# Patient Record
Sex: Male | Born: 1985 | Race: Black or African American | Hispanic: No | Marital: Single
Health system: Southern US, Community
[De-identification: ages and names within clinical notes are randomized; demographics above are authoritative.]

---

## 2013-09-17 ENCOUNTER — Emergency Department (HOSPITAL_COMMUNITY)
Admission: EM | Admit: 2013-09-17 | Discharge: 2013-09-17 | Disposition: A | Discharge status: Home / Self Care | Payer: Self-pay | Attending: Emergency Medicine | Admitting: Emergency Medicine

## 2013-09-17 ENCOUNTER — Emergency Department (HOSPITAL_COMMUNITY): Payer: Self-pay

## 2013-09-17 DIAGNOSIS — R42 Dizziness and giddiness: Secondary | ICD-10-CM | POA: Insufficient documentation

## 2013-09-17 DIAGNOSIS — K529 Noninfective gastroenteritis and colitis, unspecified: Secondary | ICD-10-CM

## 2013-09-17 DIAGNOSIS — K5289 Other specified noninfective gastroenteritis and colitis: Secondary | ICD-10-CM | POA: Insufficient documentation

## 2013-09-17 LAB — COMPREHENSIVE METABOLIC PANEL
Alkaline Phosphatase: 91 U/L (ref 39–117)
BUN: 12 mg/dL (ref 6–23)
CO2: 24 mEq/L (ref 19–32)
Chloride: 92 mEq/L — ABNORMAL LOW (ref 96–112)
GFR calc Af Amer: >90 mL/min (ref >90)
GFR calc non Af Amer: 81 mL/min — ABNORMAL LOW (ref >90)
Glucose, Bld: 99 mg/dL (ref 70–99)
Potassium: 3.3 mEq/L — ABNORMAL LOW (ref 3.5–5.1)
Total Bilirubin: 1.5 mg/dL — ABNORMAL HIGH (ref 0.3–1.2)

## 2013-09-17 LAB — URINALYSIS, ROUTINE W REFLEX MICROSCOPIC
Bilirubin Urine: NEGATIVE
Ketones, ur: 40 mg/dL — AB
Leukocytes, UA: NEGATIVE
Nitrite: NEGATIVE
Specific Gravity, Urine: 1.016 (ref 1.005–1.030)
Urobilinogen, UA: 0.2 mg/dL (ref 0.0–1.0)
pH: 6 (ref 5.0–8.0)

## 2013-09-17 LAB — CBC WITH DIFFERENTIAL/PLATELET
Hemoglobin: 15.2 g/dL (ref 13.0–17.0)
Lymphocytes Relative: 6 % — ABNORMAL LOW (ref 12–46)
Lymphs Abs: 0.8 10*3/uL (ref 0.7–4.0)
MCH: 31.3 pg (ref 26.0–34.0)
Monocytes Relative: 6 % (ref 3–12)
Neutro Abs: 11 10*3/uL — ABNORMAL HIGH (ref 1.7–7.7)
Neutrophils Relative %: 87 % — ABNORMAL HIGH (ref 43–77)
Platelets: 204 10*3/uL (ref 150–400)
RBC: 4.85 MIL/uL (ref 4.22–5.81)

## 2013-09-17 LAB — LIPASE, BLOOD: Lipase: 19 U/L (ref 11–59)

## 2013-09-17 MED ORDER — ONDANSETRON HCL 4 MG/2ML IJ SOLN
4.0000 mg | Freq: Once | INTRAMUSCULAR | Status: AC
  Administered 2013-09-17: 4 mg via INTRAVENOUS
  Filled 2013-09-17: qty 2

## 2013-09-17 MED ORDER — ONDANSETRON HCL 4 MG PO TABS: 4.0000 mg | ORAL_TABLET | Freq: Four times a day (QID) | ORAL | Status: AC

## 2013-09-17 MED ORDER — SODIUM CHLORIDE 0.9 % IV BOLUS (SEPSIS)
1000.0000 mL | Freq: Once | INTRAVENOUS | Status: AC
  Administered 2013-09-17: 1000 mL via INTRAVENOUS

## 2013-09-17 MED ORDER — ACETAMINOPHEN 325 MG PO TABS
650.0000 mg | ORAL_TABLET | Freq: Once | ORAL | Status: AC
  Administered 2013-09-17: 650 mg via ORAL
  Filled 2013-09-17: qty 2

## 2013-09-17 NOTE — ED Notes (Signed)
Pt has been sick with n/v for the past few days now and this am friends state pt "passed out" while in the bathroom. Pt does remember things denies hitting head. EMS was called and pt decided to come to the ER instead of EMS. Alert x4 at this time.

## 2013-09-17 NOTE — ED Notes (Signed)
Patient resting with eyes closed. Accompanied by roommates. Patient denies pain at this time. Will continue to monitor

## 2013-09-17 NOTE — ED Provider Notes (Signed)
CSN: [id number]     Arrival date & time 09/17/13  0940 History   First MD Initiated Contact with Patient 09/17/13 [phone]     Chief Complaint  Patient presents with  . Near Syncope    HPI Pt has been sick with n/v for the past few days now and this am friends state pt "passed out" while in the bathroom. Pt does remember things denies hitting head. EMS was called and pt decided to come to the ER instead of EMS. Alert x4 at this time.  Patient has taken ibuprofen for headache over the last week or so.  Headache has been typical he takes Profen usually goes away.  Patient denies any diarrhea.  No other roommates are ill with nausea vomiting and/or diarrhea.  Patient did have 3 episodes of forced vomiting on Friday night.  Felt somewhat better but has had decreased appetite and energy over the last 24-48 hours.  No past medical history on file. No past surgical history on file. No family history on file. History  Substance Use Topics  . Smoking status: Not on file  . Smokeless tobacco: Not on file  . Alcohol Use: Not on file    Review of Systems  Constitutional: Positive for chills. Negative for fever.  Gastrointestinal: Negative for abdominal distention.  Neurological: Positive for syncope and light-headedness. Negative for seizures.   All other systems reviewed and are negative Allergies  Review of patient's allergies indicates no known allergies.  Home Medications   Current Outpatient Rx  Name  Route  Sig  Dispense  Refill  . ibuprofen (ADVIL,MOTRIN) 200 MG tablet   Oral   Take 400 mg by mouth every 6 (six) hours as needed.         . Pseudoephedrine-Guaifenesin (MUCINEX D) [phone] MG TB12   Oral   Take 2 tablets by mouth 2 (two) times daily as needed. Congestion         . ondansetron (ZOFRAN) 4 MG tablet   Oral   Take 1 tablet (4 mg total) by mouth every 6 (six) hours.   12 tablet   0    BP 133/73  Pulse 108  Temp(Src) 101.5 F (38.6 C) (Oral)  Resp 16  SpO2  99% Physical Exam  Nursing note and vitals reviewed. Constitutional: He is oriented to person, place, and time. He appears well-developed and well-nourished. No distress (Patient is apparently nauseated).  HENT:  Head: Normocephalic and atraumatic.  Eyes: Pupils are equal, round, and reactive to light.  Neck: Normal range of motion.  Cardiovascular: Normal rate and intact distal pulses.   Pulmonary/Chest: Effort normal. No respiratory distress.  Abdominal: Soft. Normal appearance. He exhibits no distension. There is no tenderness. There is no rebound and no guarding.  Musculoskeletal: Normal range of motion.  Neurological: He is alert and oriented to person, place, and time. No cranial nerve deficit. He exhibits normal muscle tone. Coordination normal.  Skin: Skin is warm and dry. No rash noted.  Psychiatric: He has a normal mood and affect. His behavior is normal.    ED Course  Procedures (including critical care time) Medications  sodium chloride 0.9 % bolus 1,000 mL (1,000 mLs Intravenous New Bag/Given 09/17/13 1303)  sodium chloride 0.9 % bolus 1,000 mL (0 mLs Intravenous Stopped 09/17/13 1303)  ondansetron (ZOFRAN) injection 4 mg (4 mg Intravenous Given 09/17/13 1037)  acetaminophen (TYLENOL) tablet 650 mg (650 mg Oral Given 09/17/13 1037)    Labs Review Labs Reviewed  URINALYSIS, ROUTINE  W REFLEX MICROSCOPIC - Abnormal; Notable for the following:    Ketones, ur 40 (*)    All other components within normal limits  CBC WITH DIFFERENTIAL - Abnormal; Notable for the following:    WBC 12.6 (*)    Neutrophils Relative % 87 (*)    Neutro Abs 11.0 (*)    Lymphocytes Relative 6 (*)    All other components within normal limits  COMPREHENSIVE METABOLIC PANEL - Abnormal; Notable for the following:    Sodium 131 (*)    Potassium 3.3 (*)    Chloride 92 (*)    Total Bilirubin 1.5 (*)    GFR calc non Af Amer 81 (*)    All other components within normal limits  LIPASE, BLOOD    calculated anion gap = 15. Imaging Review Dg Chest 2 View  09/17/2013   CLINICAL DATA:  Fever, syncope  EXAM: CHEST  2 VIEW  COMPARISON:  None.  FINDINGS: Cardiomediastinal silhouette is unremarkable. Mild thoracic dextroscoliosis. No acute infiltrate or pleural effusion. No pulmonary edema.  IMPRESSION: No active cardiopulmonary disease.   Electronically Signed   By: Natasha Mead M.D.   On: 09/17/2013 12:24      MDM   1. Gastroenteritis    Patient feeling much better.  Wants to go home.  Appears stable for discharge.    Nelia Shi, MD 09/17/13 [phone]

## 2014-09-13 IMAGING — CR DG CHEST 2V
2 series · 2 of 2 positions shown · non-contrast
Comparison: None.

CLINICAL DATA: Fever, syncope

EXAM:
CHEST  2 VIEW

[w chest pa]
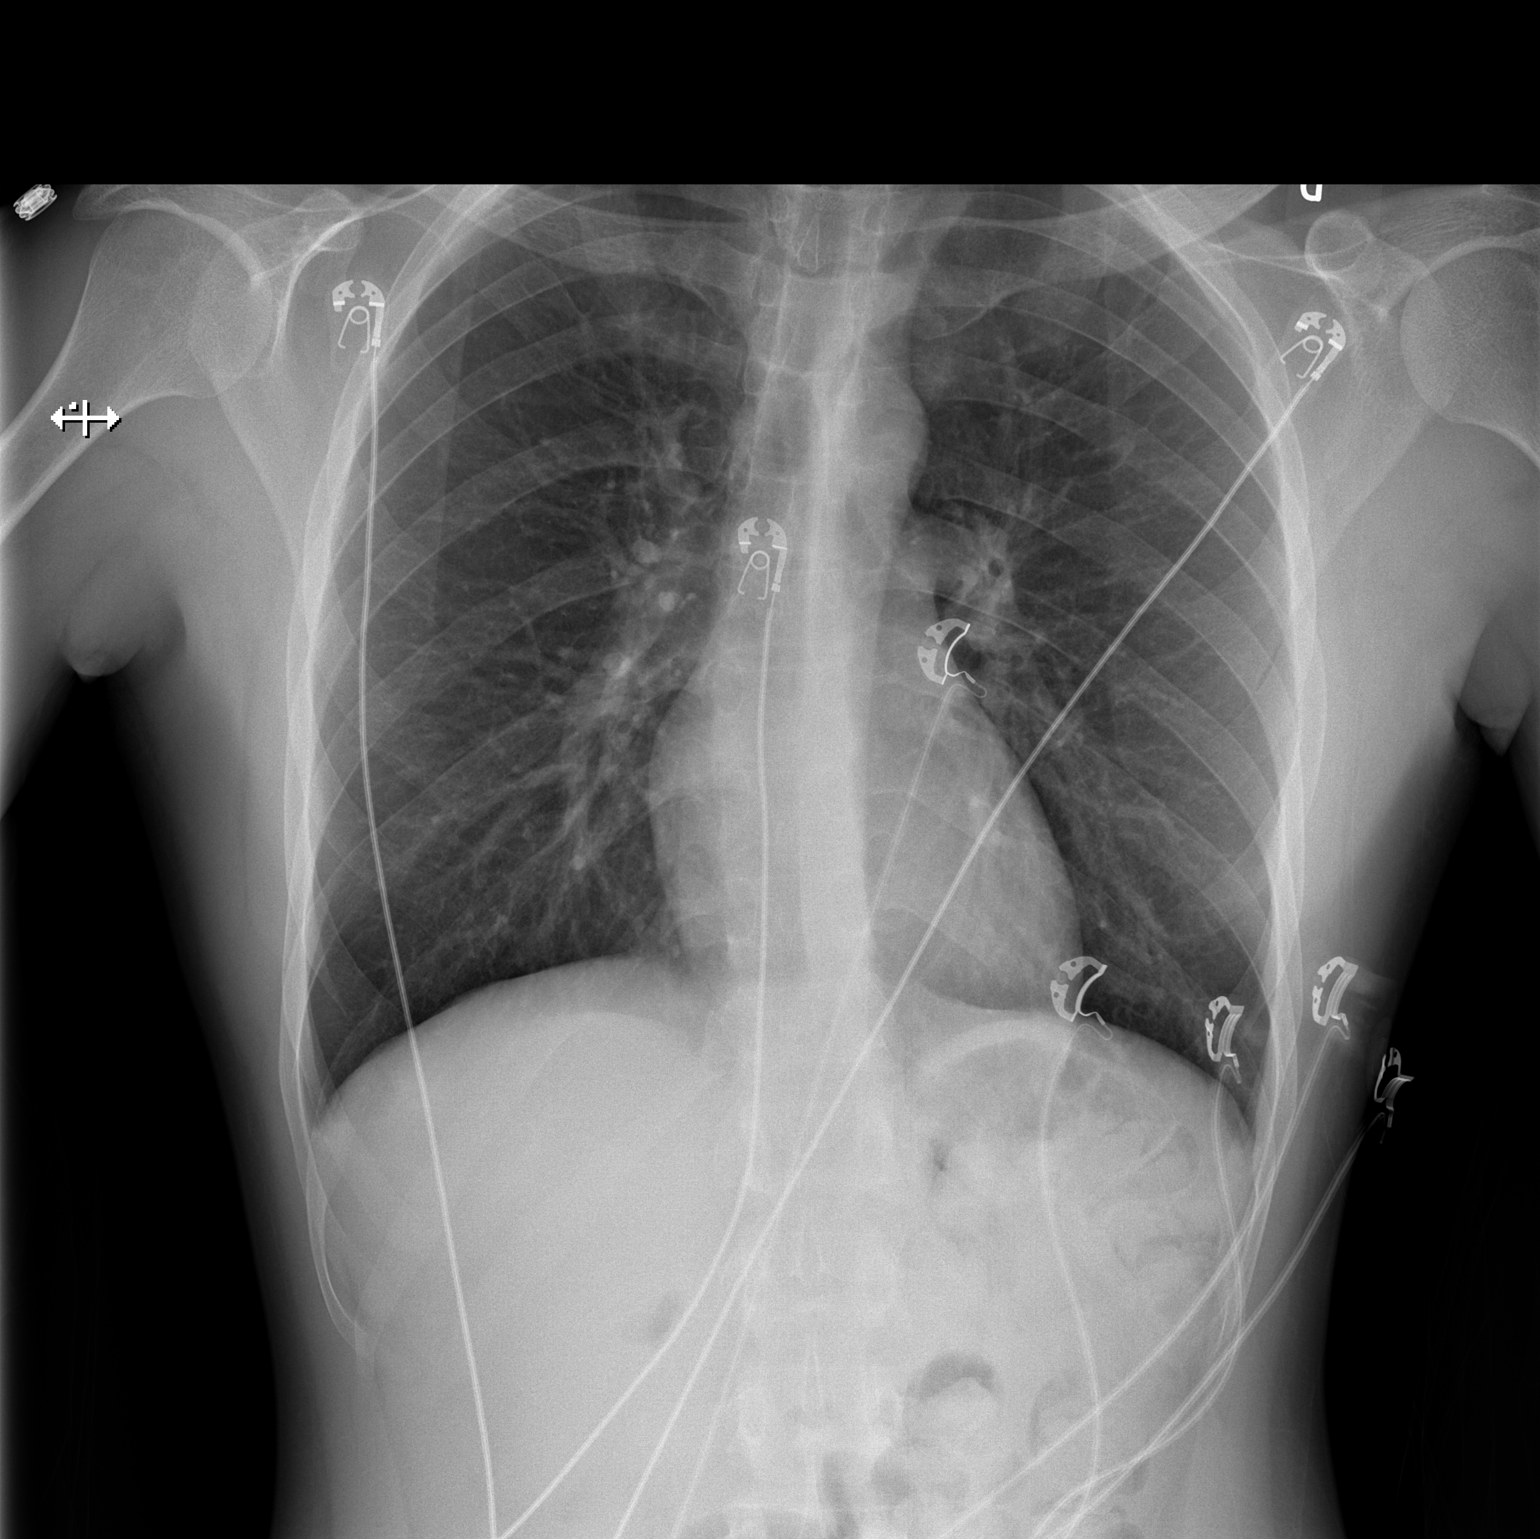

[w chest lat]
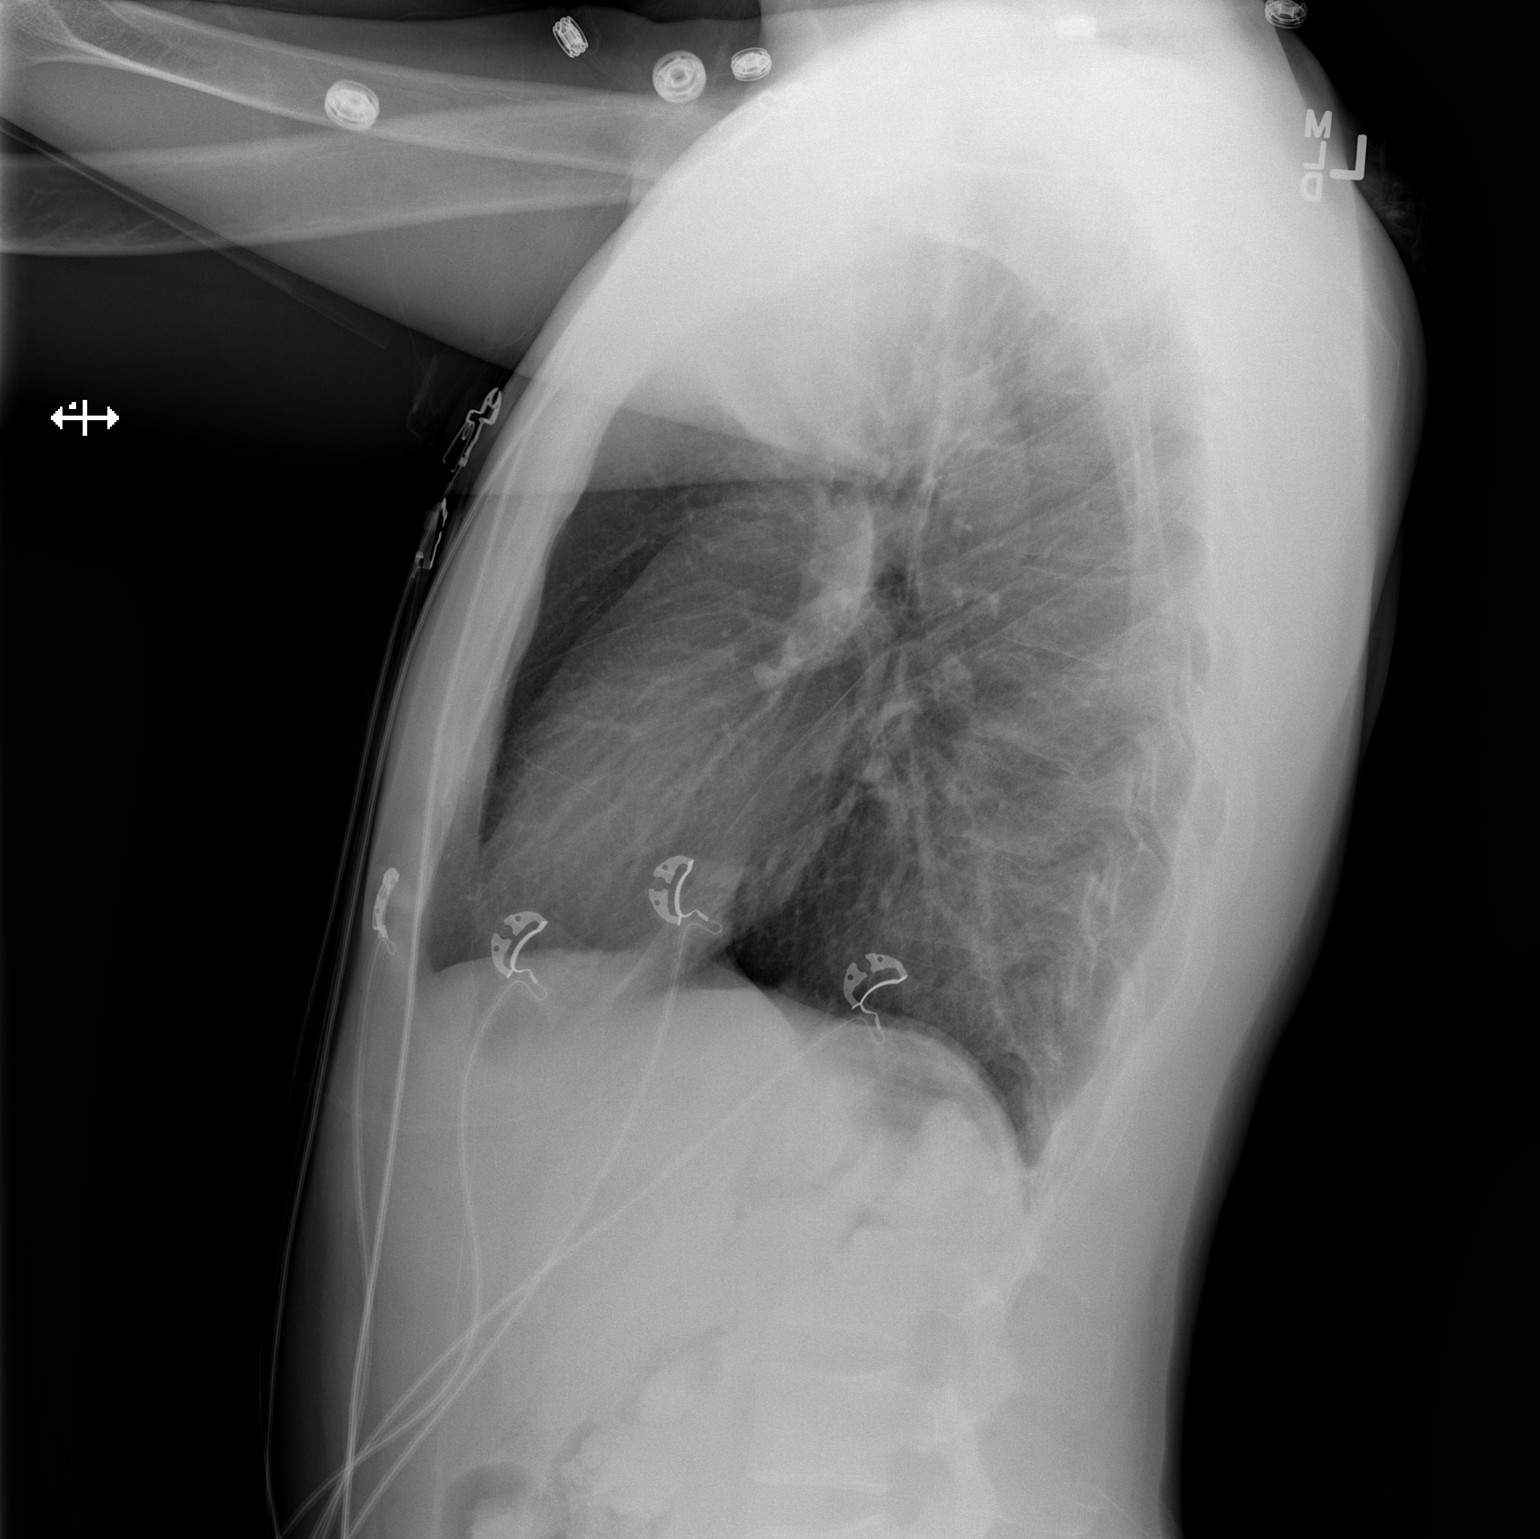

[2 of 2 positions shown; findings below may reference images not displayed]

FINDINGS: Cardiomediastinal silhouette is unremarkable. Mild thoracic
dextroscoliosis. No acute infiltrate or pleural effusion. No
pulmonary edema.
IMPRESSION: No active cardiopulmonary disease.
